# Patient Record
Sex: Male | Born: 1940 | Race: Black or African American | Hispanic: No | Marital: Married | Smoking: Never smoker
Health system: Southern US, Community
[De-identification: ages and names within clinical notes are randomized; demographics above are authoritative.]

## PROBLEM LIST (undated history)

## (undated) DIAGNOSIS — E119 Type 2 diabetes mellitus without complications: Secondary | ICD-10-CM

## (undated) DIAGNOSIS — R29810 Facial weakness: Secondary | ICD-10-CM

## (undated) DIAGNOSIS — Z86718 Personal history of other venous thrombosis and embolism: Secondary | ICD-10-CM

## (undated) DIAGNOSIS — I480 Paroxysmal atrial fibrillation: Secondary | ICD-10-CM

## (undated) DIAGNOSIS — I4891 Unspecified atrial fibrillation: Secondary | ICD-10-CM

## (undated) DIAGNOSIS — I10 Essential (primary) hypertension: Secondary | ICD-10-CM

## (undated) DIAGNOSIS — G4733 Obstructive sleep apnea (adult) (pediatric): Secondary | ICD-10-CM

## (undated) DIAGNOSIS — R600 Localized edema: Secondary | ICD-10-CM

## (undated) DIAGNOSIS — E785 Hyperlipidemia, unspecified: Secondary | ICD-10-CM

## (undated) DIAGNOSIS — C07 Malignant neoplasm of parotid gland: Secondary | ICD-10-CM

## (undated) DIAGNOSIS — R9439 Abnormal result of other cardiovascular function study: Secondary | ICD-10-CM

## (undated) HISTORY — DX: Paroxysmal atrial fibrillation: I48.0

## (undated) HISTORY — DX: Personal history of other venous thrombosis and embolism: Z86.718

## (undated) HISTORY — DX: Localized edema: R60.0

## (undated) HISTORY — DX: Facial weakness: R29.810

## (undated) HISTORY — DX: Type 2 diabetes mellitus without complications: E11.9

## (undated) HISTORY — DX: Obstructive sleep apnea (adult) (pediatric): G47.33

## (undated) HISTORY — DX: Unspecified atrial fibrillation: I48.91

## (undated) HISTORY — DX: Abnormal result of other cardiovascular function study: R94.39

## (undated) HISTORY — DX: Hyperlipidemia, unspecified: E78.5

## (undated) HISTORY — DX: Malignant neoplasm of parotid gland: C07

## (undated) HISTORY — DX: Essential (primary) hypertension: I10

---

## 1997-07-21 ENCOUNTER — Ambulatory Visit (HOSPITAL_COMMUNITY): Admission: RE | Admit: 1997-07-21 | Discharge: 1997-07-21 | Payer: Self-pay | Admitting: Cardiology

## 1999-03-07 ENCOUNTER — Encounter: Admission: RE | Admit: 1999-03-07 | Discharge: 1999-06-05 | Payer: Self-pay | Admitting: Cardiology

## 1999-03-14 ENCOUNTER — Ambulatory Visit (HOSPITAL_COMMUNITY): Admission: RE | Admit: 1999-03-14 | Discharge: 1999-03-14 | Payer: Self-pay | Admitting: Gastroenterology

## 2005-04-09 ENCOUNTER — Encounter: Admission: RE | Admit: 2005-04-09 | Discharge: 2005-04-09 | Payer: Self-pay | Admitting: Orthopedic Surgery

## 2005-05-12 ENCOUNTER — Ambulatory Visit (HOSPITAL_COMMUNITY): Admission: RE | Admit: 2005-05-12 | Discharge: 2005-05-12 | Payer: Self-pay | Admitting: Orthopedic Surgery

## 2005-06-01 ENCOUNTER — Encounter: Admission: RE | Admit: 2005-06-01 | Discharge: 2005-08-30 | Payer: Self-pay | Admitting: Orthopedic Surgery

## 2007-09-30 DIAGNOSIS — R9439 Abnormal result of other cardiovascular function study: Secondary | ICD-10-CM

## 2007-09-30 HISTORY — DX: Abnormal result of other cardiovascular function study: R94.39

## 2007-10-23 HISTORY — PX: CARDIAC CATHETERIZATION: SHX172

## 2008-01-01 DIAGNOSIS — G4733 Obstructive sleep apnea (adult) (pediatric): Secondary | ICD-10-CM

## 2008-01-01 HISTORY — DX: Obstructive sleep apnea (adult) (pediatric): G47.33

## 2010-06-08 ENCOUNTER — Inpatient Hospital Stay (HOSPITAL_COMMUNITY)
Admission: EM | Admit: 2010-06-08 | Discharge: 2010-06-10 | DRG: 074 | Disposition: A | Discharge status: Home / Self Care | Payer: Medicare Other | Attending: Family Medicine | Admitting: Family Medicine

## 2010-06-08 ENCOUNTER — Emergency Department (HOSPITAL_COMMUNITY): Payer: Medicare Other

## 2010-06-08 DIAGNOSIS — M6282 Rhabdomyolysis: Secondary | ICD-10-CM | POA: Diagnosis present

## 2010-06-08 DIAGNOSIS — IMO0001 Reserved for inherently not codable concepts without codable children: Secondary | ICD-10-CM | POA: Diagnosis present

## 2010-06-08 DIAGNOSIS — Z7901 Long term (current) use of anticoagulants: Secondary | ICD-10-CM

## 2010-06-08 DIAGNOSIS — Z7982 Long term (current) use of aspirin: Secondary | ICD-10-CM

## 2010-06-08 DIAGNOSIS — I10 Essential (primary) hypertension: Secondary | ICD-10-CM | POA: Diagnosis present

## 2010-06-08 DIAGNOSIS — I4891 Unspecified atrial fibrillation: Secondary | ICD-10-CM | POA: Diagnosis present

## 2010-06-08 DIAGNOSIS — G510 Bell's palsy: Principal | ICD-10-CM | POA: Diagnosis present

## 2010-06-08 DIAGNOSIS — R609 Edema, unspecified: Secondary | ICD-10-CM | POA: Diagnosis present

## 2010-06-08 DIAGNOSIS — G4733 Obstructive sleep apnea (adult) (pediatric): Secondary | ICD-10-CM | POA: Diagnosis present

## 2010-06-08 LAB — CK TOTAL AND CKMB (NOT AT ARMC)
CK, MB: 8.3 ng/mL (ref 0.3–4.0)
Total CK: 979 U/L — ABNORMAL HIGH (ref 7–232)

## 2010-06-08 LAB — URINALYSIS, ROUTINE W REFLEX MICROSCOPIC
Bilirubin Urine: NEGATIVE
Hgb urine dipstick: NEGATIVE
Ketones, ur: NEGATIVE mg/dL
Protein, ur: NEGATIVE mg/dL
Urobilinogen, UA: 1 mg/dL (ref 0.0–1.0)

## 2010-06-08 LAB — COMPREHENSIVE METABOLIC PANEL
ALT: 36 U/L (ref 0–53)
AST: 40 U/L — ABNORMAL HIGH (ref 0–37)
Albumin: 4 g/dL (ref 3.5–5.2)
Alkaline Phosphatase: 75 U/L (ref 39–117)
CO2: 29 mEq/L (ref 19–32)
Calcium: 9.5 mg/dL (ref 8.4–10.5)
Creatinine, Ser: 0.97 mg/dL (ref 0.4–1.5)
Glucose, Bld: 195 mg/dL — ABNORMAL HIGH (ref 70–99)
Sodium: 137 mEq/L (ref 135–145)
Total Protein: 7.4 g/dL (ref 6.0–8.3)

## 2010-06-08 LAB — CBC
MCV: 85.2 fL (ref 78.0–100.0)
Platelets: 196 10*3/uL (ref 150–400)
WBC: 5.9 10*3/uL (ref 4.0–10.5)

## 2010-06-08 LAB — DIFFERENTIAL
Basophils Absolute: 0 10*3/uL (ref 0.0–0.1)
Basophils Relative: 1 % (ref 0–1)
Eosinophils Relative: 7 % — ABNORMAL HIGH (ref 0–5)
Monocytes Absolute: 0.6 10*3/uL (ref 0.1–1.0)
Neutro Abs: 2.4 10*3/uL (ref 1.7–7.7)

## 2010-06-08 LAB — TROPONIN I: Troponin I: 0.03 ng/mL (ref 0.00–0.06)

## 2010-06-08 LAB — PROTIME-INR: Prothrombin Time: 27.7 seconds — ABNORMAL HIGH (ref 11.6–15.2)

## 2010-06-09 ENCOUNTER — Inpatient Hospital Stay (HOSPITAL_COMMUNITY): Payer: Medicare Other

## 2010-06-09 DIAGNOSIS — M7989 Other specified soft tissue disorders: Secondary | ICD-10-CM

## 2010-06-09 DIAGNOSIS — R29810 Facial weakness: Secondary | ICD-10-CM

## 2010-06-09 DIAGNOSIS — G8190 Hemiplegia, unspecified affecting unspecified side: Secondary | ICD-10-CM

## 2010-06-09 HISTORY — DX: Facial weakness: R29.810

## 2010-06-09 LAB — LIPID PANEL
HDL: 31 mg/dL — ABNORMAL LOW (ref >39)
LDL Cholesterol: 24 mg/dL (ref 0–99)
Total CHOL/HDL Ratio: 3.3 RATIO
VLDL: 48 mg/dL — ABNORMAL HIGH (ref 0–40)

## 2010-06-09 LAB — CARDIAC PANEL(CRET KIN+CKTOT+MB+TROPI)
CK, MB: 5.4 ng/mL — ABNORMAL HIGH (ref 0.3–4.0)
Total CK: 661 U/L — ABNORMAL HIGH (ref 7–232)
Troponin I: 0.01 ng/mL (ref 0.00–0.06)

## 2010-06-09 LAB — GLUCOSE, CAPILLARY
Glucose-Capillary: 192 mg/dL — ABNORMAL HIGH (ref 70–99)
Glucose-Capillary: 284 mg/dL — ABNORMAL HIGH (ref 70–99)

## 2010-06-09 LAB — HEMOGLOBIN A1C: Mean Plasma Glucose: 220 mg/dL — ABNORMAL HIGH (ref <117)

## 2010-06-10 LAB — CBC
HCT: 39 % (ref 39.0–52.0)
Hemoglobin: 13.1 g/dL (ref 13.0–17.0)
MCHC: 33.6 g/dL (ref 30.0–36.0)
MCV: 85.2 fL (ref 78.0–100.0)
RDW: 13.5 % (ref 11.5–15.5)

## 2010-06-10 LAB — BASIC METABOLIC PANEL
BUN: 12 mg/dL (ref 6–23)
CO2: 29 mEq/L (ref 19–32)
Chloride: 102 mEq/L (ref 96–112)
Creatinine, Ser: 0.94 mg/dL (ref 0.4–1.5)
GFR calc Af Amer: >60 mL/min (ref >60)
Potassium: 3.7 mEq/L (ref 3.5–5.1)

## 2010-06-10 LAB — TSH: TSH: 1.761 u[IU]/mL (ref 0.350–4.500)

## 2010-06-10 LAB — GLUCOSE, CAPILLARY
Glucose-Capillary: 231 mg/dL — ABNORMAL HIGH (ref 70–99)
Glucose-Capillary: 188 mg/dL — ABNORMAL HIGH (ref 70–99)

## 2010-06-14 NOTE — H&P (Signed)
NAME:  Jesse Wall, Jesse Wall NO.:  [id number]  MEDICAL RECORD NO.:  [id number]           PATIENT TYPE:  E  LOCATION:  [facility]                         FACILITY:  [facility]  PHYSICIAN:  Erick Blinks, MD     DATE OF BIRTH:  1940-06-25  DATE OF ADMISSION:  06/08/2010 DATE OF DISCHARGE:                             HISTORY & PHYSICAL   CHIEF COMPLAINT:  Right-sided facial droop.  HISTORY OF PRESENT ILLNESS:  This is a 70 year old male with history of hypertension, diabetes, and atrial fibrillation on Coumadin, who presents to the emergency room with complaints of right facial droop. The patient had noticed the onset of this approximately 2-3 days ago. His symptoms were also noted by his wife today.  They presents to the emergency room for evaluation.  The patient denies any slurring of his speech.  He has not had any dysphagia.  No changes in vision.  No headache.  Otherwise, he does not have any other weakness in his extremities.  He denies any numbness.  He has not had any chest pain, shortness of breath, cough, fever, dysuria, hematuria, nausea, vomiting, abdominal pain, or diarrhea.  He does have some lower extremity edema which he reports as being off and on for the past 1-2 years.  His wife noted that he has more swelling in his right lower extremity today.  The patient had a CT scan of the head in the emergency room which was found be negative and he has been referred for admission for further stroke workup.  PAST MEDICAL HISTORY: 1. Hypertension. 2. Diabetes. 3. Atrial fibrillation.  ALLERGIES:  No known drug allergies.  MEDICATIONS PRIOR TO ADMISSION: 1. Tribenzor 40/5/25 mg 1 tablet every morning. 2. Calcium/vitamin D over-the-counter 1 tablet every morning. 3. Fish oil over-the-counter once daily. 4. Cod liver oil 1 capsule every morning. 5. Vitamin D3 1000 units 1 capsule every morning. 6. Vitamin C over-the-counter 1 tablet every morning. 7. Multivitamin  1 tablet every morning. 8. Lipitor 20 mg every morning. 9. Coumadin 7.5 mg every evening. 10.Coreg 25 mg b.i.d. 11.Janumet 50/1000 mg p.o. every evening. 12.Aspirin 325 mg every morning.  SOCIAL HISTORY:  The patient is a nonsmoker, does not drink, does not use any drugs.  He has currently retired, was previously in education, and he lives at home with his wife.  He walks independently and is fairly active.  FAMILY HISTORY:  Noncontributory.  REVIEW OF SYSTEMS:  As per HPI.  PHYSICAL EXAMINATION:  VITAL SIGNS:  Temperature 98.6, respiratory rate of 16, heart rate of 60, blood pressure 151/71, pulse ox of 100% on room air. GENERAL:  The patient in no acute distress, lying comfortably in bed. HEENT:  Normocephalic, atraumatic.  Pupils are equal, round, reactive to light.  There is right-sided facial droop. NECK:  Supple. CHEST:  Clear to auscultation bilaterally. CARDIAC:  S1 and S2 which appears to be regular rate and rhythm. ABDOMEN:  Soft, nontender.  Bowel sounds are active. EXTREMITIES:  No signs of cyanosis or clubbing.  There is 1-2+ edema in the lower extremities bilaterally, even more so on the right side. NEUROLOGIC:  The patient has 5/5 strength bilaterally in his extremities.  There is a right-sided facial droop.  LABORATORY DATA:  INR of 2.5, WBC 5.9, hemoglobin 13.7, platelet count of 196,000.  Sodium 137, potassium 4.0, chloride 103, bicarb 29, glucose 195, BUN 15, creatinine 0.97, calcium 9.5, albumin 4.0.  Liver enzymes within normal limits.  Troponin of 0.03, CK-MB of 9.6, and total CK of 1021.  EKG is not available at this time.  CT head shows no acute intracranial abnormality.  ASSESSMENT AND PLAN: 1. Right-sided facial droop.  We will admit the patient to a telemetry     unit and complete the stroke workup with MRI of the brain, carotid     Dopplers, and 2-D echocardiogram.  We will continue him on Coumadin     as well as his aspirin. 2. Elevated creatine  kinase.  This may be secondary to rhabdomyolysis.     We will hold his Lipitor for now.  We will get an EKG to rule out     any ischemic changes.  We will also cycle his cardiac enzymes.  The     patient already received 2-D echocardiogram and he is already on     aspirin.  He denies any chest pain at this time. 3. Atrial fibrillation.  We will continue the patient on Coumadin per     Pharmacy. 4. Hypertension.  We will continue his outpatient medicines. 5. Pedal edema.  This may be secondary to poor venous circulation.  We     have already rechecked an 2-D echocardiogram to rule out any     congestive heart failure.  We will also check lower extremity     Dopplers to rule out any underlying deep venous thrombosis,     although this is unlikely since the patient is therapeutic on his     Coumadin. 6. Type 2 diabetes.  We will continue on sliding scale insulin and     continue his outpatient regimen. 7. Code status.  The patient is full code.  Further orders per the     clinical course.     Erick Blinks, MD     JM/MEDQ  D:  06/08/2010  T:  06/08/2010  Job:  [id number]  Electronically Signed by Durward Mallard Sophiagrace Benbrook  on 06/14/2010 09:47:04 PM

## 2010-06-22 NOTE — Discharge Summary (Signed)
NAME:  Jesse Wall, Jesse Wall NO.:  [id number]  MEDICAL RECORD NO.:  [id number]           PATIENT TYPE:  I  LOCATION:  3016                         FACILITY:  [facility]  PHYSICIAN:  Brendia Sacks, MD    DATE OF BIRTH:  26-Mar-1940  DATE OF ADMISSION:  06/08/2010 DATE OF DISCHARGE:  06/10/2010                              DISCHARGE SUMMARY   DISCHARGE DIAGNOSES: 1. Bell palsy. 2. Diabetes mellitus, uncontrolled by hemoglobin A1c, stable as an     inpatient. 3. Elevated creatinine kinase and mild elevation of CK-MB, trending     downwards. 4. Atrial fibrillation, stable.  HISTORY OF PRESENT ILLNESS:  This is a 70 year old man who appears younger than his stated age who presented with right-sided facial weakness.  HOSPITAL COURSE: 1. Bell palsy.  The patient was admitted for stroke evaluation.  This     was negative.  Presumptive diagnosis at this point is Bell palsy.     He was seen by physical therapy and not felt to have any outpatient     needs and seen by speech therapy and not felt to have any     outpatient needs.  A stroke workup was completely negative and he     is stable for discharge at this time.  Literature does suggest that     steroids are beneficial in all patients with Bell palsy and I have     recommended this to him, although I have advised that it is likely     to increase his blood sugars in the short-term although he will     only be on it for a short period of time.  Ultimately, the steroids     were started within 3 days and he does appear to be outside of that     window.  The patient will need to follow his blood sugars closely     and follow with his primary care physician. 2. Diabetes mellitus, uncontrolled by hemoglobin A1c.  Stable as an     inpatient and continue his chronic medications.  Follow up in the     outpatient setting. 3. Mild elevation of total CK and CK-MB.  This may be related to     statin therapy.  We will hold his  Lipitor until he follows up with     his primary care physician in the next few weeks.  Suggest repeat     CK and CK-MB at that time.  Defer restarting this to his primary     care physician. 4. Atrial fibrillation.  This has remained stable.  Continue Coreg and     warfarin.  CONSULTATIONS:  None.  PROCEDURES:  None.  IMAGING: 1. Chest CT of head, April 18:  No acute intracranial findings. 2. MRI of the brain, April 20:  No acute infarct.  Mild-to-moderate     small vessel disease-type changes. 3. MRA of the head, April 20:  Mild branch vessel irregularity.     Nonvisualization of the right PICA.  No discrete saccular aneurysm.     Focal bulge in superior aspect of the M1 segment  of the right     middle cerebral artery which represents origin of the vessel.  It     could be followed up in the outpatient setting as clinically     indicated.  ANCILLARY STUDIES: 1. A 2-D echocardiogram, April 19:  Left ventricular ejection fraction     65-70%, normal wall motion.  Grade 1 diastolic dysfunction. 2. Bilateral carotid ultrasound:  Negative for significant     extracranial stenosis.  Vertebrals were patent with antegrade flow. 3. Bilateral lower extremity venous Dopplers were negative for DVT.  PERTINENT LABORATORY STUDIES: 1. Triglycerides are 242, LDL was 24, HDL 31, and total cholesterol     103. 2. Hemoglobin A1c 9.3. 3. TSH 1.761. 4. Vitamin B12 was within normal limits. 5. CBC was unremarkable. 6. Discharge INR 2.69. 7. Capillary blood sugars stable. 8. Basic metabolic panel unremarkable. 9. Cardiac enzymes notable for a peak CK of 1021 and a peak MB of 9.6.     On discharge, CK is 661 and MB is 5.4.  Normal troponins.  PHYSICAL EXAMINATION ON DISCHARGE:  GENERAL:  The patient was feeling and there are no issues.  No focal neurologic deficits other than in his face.  His wife and the patient feel that his facial asymmetry and weakness have improved.  It is most dramatic  when he smiles. VITAL SIGNS:  He remains afebrile, temperature is 98.1, pulse 58, respirations 17, blood pressure 121/70, and saturation 93% on room air. CARDIOVASCULAR:  Regular rate and rhythm.  No murmur, rub, or gallop. RESPIRATORY:  Clear to auscultation bilaterally.  No wheezes, rales, or rhonchi.  Normal respiratory effort. NEUROLOGIC:  Cranial nerves are notable for weakness of his lower lip with smile, but he is able to close his eyes bilaterally symmetrically and quite tightly.  He has normal furrowing of his brow.  DISCHARGE INSTRUCTIONS:  The patient will be discharged home today. Diet is diabetic, heart-healthy diet.  Activity is as tolerated.  He should follow up with Dr. Andi Devon in 2-4 weeks.  DISCHARGE MEDICATIONS: 1. Artificial tears, use liberally as needed for dry eyes. 2. Prednisone 20 mg 3 tablets p.o. q.a.m. with food for 7 days.     Follow your blood sugars while on this medication and discuss     significant hyperglycemia with your primary care physician. 3. Aspirin 325 mg p.o. daily. 4. Calcium carbonate and vitamin D p.o. daily. 5. Carvedilol 25 mg p.o. b.i.d. 6. Cod liver oil daily. 7. Fish oil over-the-counter p.o. daily. 8. Janumet 50/1000 mg p.o. at bedtime. 9. Multivitamin p.o. daily. 10.Tribenzor 40/5/25 p.o. daily. 11.Vitamin C over-the-counter p.o. daily. 12.Vitamin D3, 1000 units every day. 13.Warfarin 7.5 mg daily.  Discontinue the following medications:  Atorvastatin.  See rationale above.  THINGS TO FOLLOW UP IN THE OUTPATIENT SETTING: 1. Continue treatment for diabetes. 2. Elevated CK and CK-MB.  See discussion above.  TIME COORDINATING DISCHARGE:  35 minutes.     Brendia Sacks, MD     DG/MEDQ  D:  06/10/2010  T:  06/11/2010  Job:  [id number]  Electronically Signed by Brendia Sacks  on 06/22/2010 09:48:56 PM

## 2010-11-07 ENCOUNTER — Other Ambulatory Visit: Payer: Self-pay | Admitting: Otolaryngology

## 2010-11-07 ENCOUNTER — Other Ambulatory Visit (HOSPITAL_COMMUNITY)
Admission: RE | Admit: 2010-11-07 | Discharge: 2010-11-07 | Disposition: A | Discharge status: Home / Self Care | Payer: Medicare Other | Source: Ambulatory Visit | Attending: Otolaryngology | Admitting: Otolaryngology

## 2010-11-07 DIAGNOSIS — R221 Localized swelling, mass and lump, neck: Secondary | ICD-10-CM

## 2010-11-07 DIAGNOSIS — R220 Localized swelling, mass and lump, head: Secondary | ICD-10-CM | POA: Insufficient documentation

## 2010-11-08 ENCOUNTER — Ambulatory Visit
Admission: RE | Admit: 2010-11-08 | Discharge: 2010-11-08 | Disposition: A | Discharge status: Home / Self Care | Payer: Medicare Other | Source: Ambulatory Visit | Attending: Otolaryngology | Admitting: Otolaryngology

## 2010-11-08 DIAGNOSIS — R221 Localized swelling, mass and lump, neck: Secondary | ICD-10-CM

## 2010-11-08 MED ORDER — IOHEXOL 300 MG/ML  SOLN
75.0000 mL | Freq: Once | INTRAMUSCULAR | Status: AC | PRN
  Administered 2010-11-08: 75 mL via INTRAVENOUS

## 2010-11-09 ENCOUNTER — Other Ambulatory Visit: Payer: Self-pay | Admitting: Otolaryngology

## 2010-11-09 ENCOUNTER — Other Ambulatory Visit (HOSPITAL_COMMUNITY): Payer: Self-pay | Admitting: Otolaryngology

## 2010-11-09 DIAGNOSIS — K118 Other diseases of salivary glands: Secondary | ICD-10-CM

## 2010-11-10 ENCOUNTER — Ambulatory Visit
Admission: RE | Admit: 2010-11-10 | Discharge: 2010-11-10 | Disposition: A | Discharge status: Home / Self Care | Payer: Medicare Other | Source: Ambulatory Visit | Attending: Otolaryngology | Admitting: Otolaryngology

## 2010-11-10 DIAGNOSIS — K118 Other diseases of salivary glands: Secondary | ICD-10-CM

## 2010-11-10 MED ORDER — GADOBENATE DIMEGLUMINE 529 MG/ML IV SOLN
20.0000 mL | Freq: Once | INTRAVENOUS | Status: AC | PRN
  Administered 2010-11-10: 20 mL via INTRAVENOUS

## 2010-11-11 ENCOUNTER — Other Ambulatory Visit (HOSPITAL_COMMUNITY): Payer: Medicare Other

## 2010-11-16 ENCOUNTER — Other Ambulatory Visit (HOSPITAL_COMMUNITY): Payer: Medicare Other

## 2011-07-27 ENCOUNTER — Ambulatory Visit
Admission: RE | Admit: 2011-07-27 | Discharge: 2011-07-27 | Disposition: A | Discharge status: Home / Self Care | Payer: Medicare Other | Source: Ambulatory Visit | Attending: Family Medicine | Admitting: Family Medicine

## 2011-07-27 ENCOUNTER — Other Ambulatory Visit: Payer: Self-pay | Admitting: Family Medicine

## 2011-07-27 DIAGNOSIS — R52 Pain, unspecified: Secondary | ICD-10-CM

## 2012-03-04 ENCOUNTER — Encounter: Payer: Self-pay | Admitting: *Deleted

## 2012-05-08 ENCOUNTER — Ambulatory Visit: Payer: Self-pay | Admitting: Cardiovascular Disease

## 2012-05-08 DIAGNOSIS — I4891 Unspecified atrial fibrillation: Secondary | ICD-10-CM | POA: Insufficient documentation

## 2012-05-08 DIAGNOSIS — Z7901 Long term (current) use of anticoagulants: Secondary | ICD-10-CM | POA: Insufficient documentation

## 2012-07-18 ENCOUNTER — Ambulatory Visit (INDEPENDENT_AMBULATORY_CARE_PROVIDER_SITE_OTHER): Payer: Medicare PPO | Admitting: Pharmacist Clinician (PhC)/ Clinical Pharmacy Specialist

## 2012-07-18 VITALS — BP 148/72 | HR 60

## 2012-07-18 DIAGNOSIS — Z7901 Long term (current) use of anticoagulants: Secondary | ICD-10-CM

## 2012-07-18 DIAGNOSIS — I4891 Unspecified atrial fibrillation: Secondary | ICD-10-CM

## 2012-07-18 LAB — POCT INR: INR: 3

## 2012-07-29 ENCOUNTER — Other Ambulatory Visit: Payer: Self-pay | Admitting: Cardiovascular Disease

## 2012-08-15 ENCOUNTER — Ambulatory Visit (INDEPENDENT_AMBULATORY_CARE_PROVIDER_SITE_OTHER): Payer: Medicare PPO | Admitting: Pharmacist Clinician (PhC)/ Clinical Pharmacy Specialist

## 2012-08-15 VITALS — BP 140/66 | HR 60

## 2012-08-15 DIAGNOSIS — I4891 Unspecified atrial fibrillation: Secondary | ICD-10-CM

## 2012-08-15 DIAGNOSIS — Z7901 Long term (current) use of anticoagulants: Secondary | ICD-10-CM

## 2012-08-15 LAB — POCT INR: INR: 3.4

## 2012-08-26 ENCOUNTER — Other Ambulatory Visit: Payer: Self-pay

## 2012-08-26 MED ORDER — ATORVASTATIN CALCIUM 10 MG PO TABS: 10.0000 mg | ORAL_TABLET | Freq: Every day | ORAL | Status: DC

## 2012-08-26 MED ORDER — CARVEDILOL 25 MG PO TABS: 25.0000 mg | ORAL_TABLET | Freq: Two times a day (BID) | ORAL | Status: DC

## 2012-08-26 NOTE — Telephone Encounter (Signed)
Rx was sent to pharmacy electronically. 

## 2012-09-06 ENCOUNTER — Ambulatory Visit: Payer: Medicare PPO | Admitting: Pharmacist Clinician (PhC)/ Clinical Pharmacy Specialist

## 2012-09-09 ENCOUNTER — Ambulatory Visit (INDEPENDENT_AMBULATORY_CARE_PROVIDER_SITE_OTHER): Payer: Medicare PPO | Admitting: Pharmacist Clinician (PhC)/ Clinical Pharmacy Specialist

## 2012-09-09 VITALS — BP 150/70 | HR 72

## 2012-09-09 DIAGNOSIS — I4891 Unspecified atrial fibrillation: Secondary | ICD-10-CM

## 2012-09-09 DIAGNOSIS — Z7901 Long term (current) use of anticoagulants: Secondary | ICD-10-CM

## 2012-09-19 ENCOUNTER — Encounter: Payer: Self-pay | Admitting: Cardiovascular Disease

## 2012-09-19 ENCOUNTER — Ambulatory Visit (INDEPENDENT_AMBULATORY_CARE_PROVIDER_SITE_OTHER): Payer: Medicare PPO | Admitting: Cardiovascular Disease

## 2012-09-19 VITALS — BP 136/82 | HR 61 | Ht 74.0 in | Wt 254.0 lb

## 2012-09-19 DIAGNOSIS — I10 Essential (primary) hypertension: Secondary | ICD-10-CM

## 2012-09-19 DIAGNOSIS — E785 Hyperlipidemia, unspecified: Secondary | ICD-10-CM

## 2012-09-19 DIAGNOSIS — I4891 Unspecified atrial fibrillation: Secondary | ICD-10-CM

## 2012-09-19 DIAGNOSIS — E119 Type 2 diabetes mellitus without complications: Secondary | ICD-10-CM | POA: Insufficient documentation

## 2012-09-19 DIAGNOSIS — G4733 Obstructive sleep apnea (adult) (pediatric): Secondary | ICD-10-CM | POA: Insufficient documentation

## 2012-09-19 DIAGNOSIS — I480 Paroxysmal atrial fibrillation: Secondary | ICD-10-CM | POA: Insufficient documentation

## 2012-09-19 NOTE — Assessment & Plan Note (Signed)
On statin therapy followed by his PCP 

## 2012-09-19 NOTE — Assessment & Plan Note (Signed)
Well-controlled on current medications 

## 2012-09-19 NOTE — Progress Notes (Signed)
09/19/2012 Jesse Wall   26-May-1940  [id number]  Primary Physician Alva Garnet., MD Primary Cardiologist: Runell Gess MD [name]   HPI:  The patient is a delightful, 72 year old, moderately overweight, married [place] American male, father of 3 children, grandfather to 1 grandchild who I last saw 6 months ago. 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 His other vessels were normal, as was his LV function. The cath was done after a mildly abnormal Myoview. His other problems include obstructive sleep apnea and no longer wearing CPAP, hypertension, hyperlipidemia and type 2 diabetes. He denies chest pain or shortness of breath. 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 He does complain of dependent edema in his left- greater-than-right lower extremity consistent with venous reflux with some cutaneous changes thereof. 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 He did have venous reflux in the right greater saphenous vein. He denies chest pain or shortness of breath. He's not had any recurrent episodes of PAF and is on Coumadin anticoagulation.    Current Outpatient Prescriptions  Medication Sig Dispense Refill  . aspirin 325 MG tablet Take 325 mg by mouth daily.      Marland Kitchen atorvastatin (LIPITOR) 10 MG tablet Take 1 tablet (10 mg total) by mouth daily.  90 tablet  0  . calcium carbonate (OS-CAL) 600 MG TABS Take 600 mg by mouth daily.      . carvedilol (COREG) 25 MG tablet Take 25 mg by mouth. 1/2 tablet twice a day      . cholecalciferol (VITAMIN D) 1000 UNITS tablet Take 1,000 Units by mouth daily.      . Coenzyme Q10 (CO Q 10) 100 MG CAPS Take 1 capsule by mouth daily.         . fish oil-omega-3 fatty acids 1000 MG capsule Take 1 g by mouth daily.      . Magnesium 250 MG TABS Take 250 mg by mouth 2 (two) times daily.      . Multiple Vitamin (MULTIVITAMIN) capsule Take 1 capsule by mouth daily.      Marland Kitchen PREVIDENT 5000 BOOSTER PLUS 1.1 % PSTE       . sitaGLIPtan-metformin (JANUMET) 50-1000 MG per tablet Take 1 tablet by mouth daily.       . valsartan-hydrochlorothiazide (DIOVAN-HCT) 160-25 MG per tablet       . vitamin C (ASCORBIC ACID) 500 MG tablet Take 500 mg by mouth daily.      Marland Kitchen warfarin (COUMADIN) 7.5 MG tablet TAKE 1 TABLET BY MOUTH EVERY DAY  90 tablet  2   No current facility-administered medications for this visit.    No Known Allergies  History   Social History  . Marital Status: Married    Spouse Name: N/A    Number of Children: 3  . Years of Education: N/A   Occupational History  . Not on file.   Social History Main Topics  . Smoking status: Never Smoker   . Smokeless tobacco: Not on file  . Alcohol Use: Not on file  . Drug Use: Not on file  . Sexually Active: Not on file   Other Topics Concern  . Not on file   Social History Narrative  .  No narrative on file     Review of Systems: General: negative for chills, fever, night sweats or weight changes.  Cardiovascular: negative for chest pain, dyspnea on exertion, edema, orthopnea, palpitations, paroxysmal nocturnal dyspnea or shortness of breath Dermatological: negative for rash Respiratory: negative for cough or wheezing Urologic: negative for hematuria Abdominal: negative for nausea, vomiting, diarrhea, bright red blood per rectum, melena, or hematemesis Neurologic: negative for visual changes, syncope, or dizziness All other systems reviewed and are otherwise negative except as noted above.    Blood pressure 136/82, pulse 61, height 6\' 2"  (1.88 m), weight 254 lb (115.214 kg).  General appearance: alert and no distress Neck: no adenopathy, no carotid bruit, no JVD, supple,  symmetrical, trachea midline and thyroid not enlarged, symmetric, no tenderness/mass/nodules Lungs: clear to auscultation bilaterally Heart: regular rate and rhythm, S1, S2 normal, no murmur, click, rub or gallop Extremities: extremities normal, atraumatic, no cyanosis or edema  EKG normal sinus rhythm at 61 without ST or T wave changes  ASSESSMENT AND PLAN:   Essential hypertension Well-controlled on current medications  Hyperlipidemia On statin therapy followed by his PCP  Atrial fibrillation Maintaining sinus rhythm on Coumadin anticoagulation      Runell Gess MD [facility], [facility] 09/19/2012 4:54 PM

## 2012-09-19 NOTE — Assessment & Plan Note (Signed)
Maintaining sinus rhythm on Coumadin anticoagulation 

## 2012-09-19 NOTE — Patient Instructions (Addendum)
Your physician wants you to follow-up in: 12 months with Dr Berry. You will receive a reminder letter in the mail two months in advance. If you don't receive a letter, please call our office to schedule the follow-up appointment.  

## 2012-10-03 ENCOUNTER — Ambulatory Visit (INDEPENDENT_AMBULATORY_CARE_PROVIDER_SITE_OTHER): Payer: Medicare PPO | Admitting: Pharmacist Clinician (PhC)/ Clinical Pharmacy Specialist

## 2012-10-03 VITALS — BP 150/66 | HR 64

## 2012-10-03 DIAGNOSIS — I4891 Unspecified atrial fibrillation: Secondary | ICD-10-CM

## 2012-10-03 DIAGNOSIS — Z7901 Long term (current) use of anticoagulants: Secondary | ICD-10-CM

## 2012-10-24 ENCOUNTER — Ambulatory Visit (INDEPENDENT_AMBULATORY_CARE_PROVIDER_SITE_OTHER): Payer: Medicare PPO | Admitting: Pharmacist Clinician (PhC)/ Clinical Pharmacy Specialist

## 2012-10-24 VITALS — BP 136/70 | HR 64

## 2012-10-24 DIAGNOSIS — I4891 Unspecified atrial fibrillation: Secondary | ICD-10-CM

## 2012-10-24 DIAGNOSIS — Z7901 Long term (current) use of anticoagulants: Secondary | ICD-10-CM

## 2012-11-11 ENCOUNTER — Telehealth: Payer: Self-pay | Admitting: Pharmacist Clinician (PhC)/ Clinical Pharmacy Specialist

## 2012-11-11 NOTE — Telephone Encounter (Signed)
Does he need to be off his blood thinner for dental procedure? If so how long? Please call asap

## 2012-11-11 NOTE — Telephone Encounter (Signed)
Pt to have 1 tooth extracted.  Advised we prefer not to stop warfarin other that night before.  Pt can use ice/compresses to help decrease bleeding risk.

## 2012-11-15 ENCOUNTER — Telehealth: Payer: Self-pay | Admitting: Cardiovascular Disease

## 2012-11-15 NOTE — Telephone Encounter (Signed)
Having surgery and needs to know when he should be off of his coumadin , just had a tooth extraction and not really on it right now and was suggested to that he stays off of it until the surgery , would like to confirm that .Marland Kitchen Please Call    Thanks

## 2012-11-15 NOTE — Telephone Encounter (Signed)
Pt had tooth extracted Wednesday (9/24) and has held coumadin since then (on his own).  Was planning to restart today, but now scheduled for endoscopy on Tuesday 9/30.  Wanted to know if okay to continue holding warfarin until then.  Advised to continue holding and restart Tuesday pm after procedure.  Pt voiced understanding.

## 2012-11-21 ENCOUNTER — Ambulatory Visit: Payer: Medicare PPO | Admitting: Pharmacist Clinician (PhC)/ Clinical Pharmacy Specialist

## 2012-11-25 ENCOUNTER — Ambulatory Visit (INDEPENDENT_AMBULATORY_CARE_PROVIDER_SITE_OTHER): Payer: Medicare PPO | Admitting: Pharmacist Clinician (PhC)/ Clinical Pharmacy Specialist

## 2012-11-25 VITALS — BP 140/68 | HR 64

## 2012-11-25 DIAGNOSIS — I4891 Unspecified atrial fibrillation: Secondary | ICD-10-CM

## 2012-11-25 DIAGNOSIS — Z7901 Long term (current) use of anticoagulants: Secondary | ICD-10-CM

## 2012-11-25 LAB — POCT INR: INR: 1.4

## 2012-12-10 ENCOUNTER — Ambulatory Visit (INDEPENDENT_AMBULATORY_CARE_PROVIDER_SITE_OTHER): Payer: Medicare PPO | Admitting: Pharmacist Clinician (PhC)/ Clinical Pharmacy Specialist

## 2012-12-10 VITALS — BP 160/80

## 2012-12-10 DIAGNOSIS — Z7901 Long term (current) use of anticoagulants: Secondary | ICD-10-CM

## 2012-12-10 DIAGNOSIS — I4891 Unspecified atrial fibrillation: Secondary | ICD-10-CM

## 2012-12-26 ENCOUNTER — Ambulatory Visit (INDEPENDENT_AMBULATORY_CARE_PROVIDER_SITE_OTHER): Payer: Medicare PPO | Admitting: Pharmacist Clinician (PhC)/ Clinical Pharmacy Specialist

## 2012-12-26 DIAGNOSIS — I4891 Unspecified atrial fibrillation: Secondary | ICD-10-CM

## 2012-12-26 DIAGNOSIS — Z7901 Long term (current) use of anticoagulants: Secondary | ICD-10-CM

## 2012-12-26 NOTE — Progress Notes (Signed)
Opened in error, pt warfarin on hold for ches tube insertion next Monday

## 2012-12-31 ENCOUNTER — Telehealth: Payer: Self-pay | Admitting: Cardiovascular Disease

## 2012-12-31 NOTE — Telephone Encounter (Signed)
Was on coumadin.  Told to stop a few days ago for procedure.  Procedure was done yesterday.  Wants to know when he can restart and when does he need inr ck and can they get the orders to check inr in his home. Please call

## 2012-12-31 NOTE — Telephone Encounter (Signed)
[name], pharmacist, notified and advised pt came in to last anticoag visit and was holding warfarin for chest tube placement.  Did not check INR.  Advised if chest tube is placed and secure, pt should restart coumadin and have INR check on Monday.  HH can check INR.    Returned call to [place], [facility] nurse.  Informed as stated above and verbalized understanding.  Asked what dose pt is to restart and informed per last anticoag vist: Sun, Tues, Thurs: 7.5mg  and other days 3.75 mg.  Verbalized understanding and will call on Monday w/ PT/INR results.

## 2013-01-06 ENCOUNTER — Telehealth: Payer: Self-pay | Admitting: *Deleted

## 2013-01-06 LAB — POCT INR: INR: 1.3

## 2013-01-06 NOTE — Telephone Encounter (Signed)
Dorene Grebe called in PT/INR results.  Stated pt is supposed to take 7.5 mg Tu/Th and 3.75 all other days.  Stated pt missed last night's dose and tooka t 7am prior to nurse's arrival.  Informed pharmacist will be notified and will call back w/ instructions.  Verbalized understanding.  PT - 12.9 INR - 1.3

## 2013-01-07 ENCOUNTER — Ambulatory Visit (INDEPENDENT_AMBULATORY_CARE_PROVIDER_SITE_OTHER): Payer: Medicare PPO | Admitting: Pharmacist Clinician (PhC)/ Clinical Pharmacy Specialist

## 2013-01-07 DIAGNOSIS — I4891 Unspecified atrial fibrillation: Secondary | ICD-10-CM

## 2013-01-07 DIAGNOSIS — Z7901 Long term (current) use of anticoagulants: Secondary | ICD-10-CM

## 2013-01-07 NOTE — Telephone Encounter (Signed)
See anticoag note

## 2013-01-12 ENCOUNTER — Other Ambulatory Visit: Payer: Self-pay | Admitting: Cardiovascular Disease

## 2013-01-13 ENCOUNTER — Telehealth: Payer: Self-pay | Admitting: Cardiovascular Disease

## 2013-01-13 NOTE — Telephone Encounter (Signed)
Rx was sent to pharmacy electronically. 

## 2013-01-13 NOTE — Telephone Encounter (Signed)
Please advise 

## 2013-01-13 NOTE — Telephone Encounter (Signed)
Jesse Wall [facility] called in PT/INR resuls.  PT 25.7 INR 2.6  Stated pt takes 7.5 mg Sun, Tues, Thurs and 3.75 mg all other days.  Message forwarded to K. Petra Kuba, RN to discuss w/ Dr. Allyson Sabal as [name], PharmD, is out of the office today.

## 2013-01-14 ENCOUNTER — Ambulatory Visit (INDEPENDENT_AMBULATORY_CARE_PROVIDER_SITE_OTHER): Payer: Medicare PPO | Admitting: Pharmacist Clinician (PhC)/ Clinical Pharmacy Specialist

## 2013-01-14 DIAGNOSIS — I4891 Unspecified atrial fibrillation: Secondary | ICD-10-CM

## 2013-01-14 DIAGNOSIS — Z7901 Long term (current) use of anticoagulants: Secondary | ICD-10-CM

## 2013-01-21 ENCOUNTER — Telehealth: Payer: Self-pay | Admitting: Cardiovascular Disease

## 2013-01-21 ENCOUNTER — Ambulatory Visit (INDEPENDENT_AMBULATORY_CARE_PROVIDER_SITE_OTHER): Payer: Medicare PPO | Admitting: Pharmacist Clinician (PhC)/ Clinical Pharmacy Specialist

## 2013-01-21 DIAGNOSIS — I4891 Unspecified atrial fibrillation: Secondary | ICD-10-CM

## 2013-01-21 DIAGNOSIS — Z7901 Long term (current) use of anticoagulants: Secondary | ICD-10-CM

## 2013-01-21 NOTE — Telephone Encounter (Signed)
PT - 23.3 INR - 2.3  Current dose: 7.5 mg Sun, Tues, Thurs and 3.75 mg other days

## 2013-01-21 NOTE — Telephone Encounter (Signed)
See anticoag encounter

## 2013-02-04 ENCOUNTER — Ambulatory Visit (INDEPENDENT_AMBULATORY_CARE_PROVIDER_SITE_OTHER): Payer: Medicare PPO | Admitting: Pharmacist Clinician (PhC)/ Clinical Pharmacy Specialist

## 2013-02-04 ENCOUNTER — Telehealth: Payer: Self-pay | Admitting: Cardiovascular Disease

## 2013-02-04 DIAGNOSIS — I4891 Unspecified atrial fibrillation: Secondary | ICD-10-CM

## 2013-02-04 DIAGNOSIS — Z7901 Long term (current) use of anticoagulants: Secondary | ICD-10-CM

## 2013-02-04 LAB — POCT INR: INR: 1.5

## 2013-02-04 NOTE — Telephone Encounter (Signed)
See anticoag encounter

## 2013-02-04 NOTE — Telephone Encounter (Signed)
INR 1.5 PT 15.2  Takes 7.5 mg Sun, Tues, Thurs and 3.75 mg all other days.  Had 3.75 mg last night, eaten broccoli and just finished a round of chemo last week.  Message forwarded to K. Alvstad, PharmD.

## 2013-02-10 ENCOUNTER — Telehealth: Payer: Self-pay | Admitting: Cardiovascular Disease

## 2013-02-10 NOTE — Telephone Encounter (Signed)
Returned call to [place], [facility] nurse w/ Genevieve Norlander.  Stated orders for INR check today and pt unable to see her today r/t multiple doctor's appts.  Asked for order to check tomorrow.  Informed, per last anticoag note, that next INR check scheduled for tomorrow, 12.23.14.  VO given to check tomorrow per notes.  Verbalized understanding.

## 2013-02-10 NOTE — Telephone Encounter (Signed)
Order for INR written today but patient unable to get done because other dr appts.  Needs to get orders for 02/11/13  Please call can leave voice mail

## 2013-02-11 ENCOUNTER — Ambulatory Visit (INDEPENDENT_AMBULATORY_CARE_PROVIDER_SITE_OTHER): Payer: Medicare PPO | Admitting: Pharmacist Clinician (PhC)/ Clinical Pharmacy Specialist

## 2013-02-11 DIAGNOSIS — Z7901 Long term (current) use of anticoagulants: Secondary | ICD-10-CM

## 2013-02-11 DIAGNOSIS — I4891 Unspecified atrial fibrillation: Secondary | ICD-10-CM

## 2013-02-18 ENCOUNTER — Ambulatory Visit (INDEPENDENT_AMBULATORY_CARE_PROVIDER_SITE_OTHER): Payer: Medicare PPO | Admitting: Pharmacist Clinician (PhC)/ Clinical Pharmacy Specialist

## 2013-02-18 DIAGNOSIS — Z7901 Long term (current) use of anticoagulants: Secondary | ICD-10-CM

## 2013-02-18 DIAGNOSIS — I4891 Unspecified atrial fibrillation: Secondary | ICD-10-CM

## 2013-02-25 ENCOUNTER — Ambulatory Visit (INDEPENDENT_AMBULATORY_CARE_PROVIDER_SITE_OTHER): Payer: Medicare PPO | Admitting: Pharmacist Clinician (PhC)/ Clinical Pharmacy Specialist

## 2013-02-25 ENCOUNTER — Telehealth: Payer: Self-pay | Admitting: Cardiovascular Disease

## 2013-02-25 DIAGNOSIS — I4891 Unspecified atrial fibrillation: Secondary | ICD-10-CM

## 2013-02-25 DIAGNOSIS — Z7901 Long term (current) use of anticoagulants: Secondary | ICD-10-CM

## 2013-02-25 LAB — POCT INR: INR: 2.6

## 2013-02-25 NOTE — Telephone Encounter (Signed)
Message forwarded to K. Alvstad, PharmD.  

## 2013-02-25 NOTE — Telephone Encounter (Signed)
See anticoag note

## 2013-02-25 NOTE — Telephone Encounter (Signed)
Calling to report an inr result.  Please call.

## 2013-03-04 ENCOUNTER — Ambulatory Visit (INDEPENDENT_AMBULATORY_CARE_PROVIDER_SITE_OTHER): Payer: Medicare PPO | Admitting: Pharmacist Clinician (PhC)/ Clinical Pharmacy Specialist

## 2013-03-04 DIAGNOSIS — I4891 Unspecified atrial fibrillation: Secondary | ICD-10-CM

## 2013-03-04 DIAGNOSIS — Z7901 Long term (current) use of anticoagulants: Secondary | ICD-10-CM

## 2013-03-04 LAB — POCT INR: INR: 2.2

## 2013-03-18 ENCOUNTER — Ambulatory Visit (INDEPENDENT_AMBULATORY_CARE_PROVIDER_SITE_OTHER): Payer: Medicare PPO | Admitting: Pharmacist Clinician (PhC)/ Clinical Pharmacy Specialist

## 2013-03-18 DIAGNOSIS — I4891 Unspecified atrial fibrillation: Secondary | ICD-10-CM

## 2013-03-18 DIAGNOSIS — Z7901 Long term (current) use of anticoagulants: Secondary | ICD-10-CM

## 2013-03-18 LAB — POCT INR: INR: 2.1

## 2013-03-28 ENCOUNTER — Other Ambulatory Visit: Payer: Self-pay | Admitting: Cardiovascular Disease

## 2013-03-31 NOTE — Telephone Encounter (Signed)
Rx was sent to pharmacy electronically. 

## 2013-04-01 ENCOUNTER — Ambulatory Visit (INDEPENDENT_AMBULATORY_CARE_PROVIDER_SITE_OTHER): Payer: Medicare PPO | Admitting: Pharmacist Clinician (PhC)/ Clinical Pharmacy Specialist

## 2013-04-01 ENCOUNTER — Telehealth: Payer: Self-pay | Admitting: *Deleted

## 2013-04-01 DIAGNOSIS — Z7901 Long term (current) use of anticoagulants: Secondary | ICD-10-CM

## 2013-04-01 DIAGNOSIS — I4891 Unspecified atrial fibrillation: Secondary | ICD-10-CM

## 2013-04-01 LAB — POCT INR: INR: 2.3

## 2013-04-01 NOTE — Telephone Encounter (Signed)
Jesse Wall was calling in PT/INR   PT 23.0 INR 2.3  Current dose of coumadin 3.75mg   M, W F and all other 7.5 mg

## 2013-04-01 NOTE — Progress Notes (Signed)
INR 2.3 confirmed with Langley Gauss

## 2013-04-01 NOTE — Telephone Encounter (Signed)
See anticoag note

## 2013-04-15 ENCOUNTER — Telehealth: Payer: Self-pay | Admitting: Nurse Practitioner

## 2013-04-15 LAB — POCT INR: INR: 1.7

## 2013-04-15 NOTE — Telephone Encounter (Signed)
Jesse Wall calling with today's INR result - 1.7  Patient currently taking 7.5 mg alternating with 3.75 mg  I have increased his dose to 7.5 mg for 5 days and 3.75 mg for 2 days. Recheck in 2 weeks.   [name], RN, [place] [address] [place], [place]  [address] [phone]

## 2013-04-16 ENCOUNTER — Ambulatory Visit (INDEPENDENT_AMBULATORY_CARE_PROVIDER_SITE_OTHER): Payer: Medicare PPO | Admitting: Pharmacist Clinician (PhC)/ Clinical Pharmacy Specialist

## 2013-04-16 DIAGNOSIS — Z7901 Long term (current) use of anticoagulants: Secondary | ICD-10-CM

## 2013-04-16 DIAGNOSIS — I4891 Unspecified atrial fibrillation: Secondary | ICD-10-CM

## 2013-05-21 DEATH — deceased

## 2013-07-16 ENCOUNTER — Ambulatory Visit: Payer: Self-pay | Admitting: Pharmacist Clinician (PhC)/ Clinical Pharmacy Specialist

## 2013-07-16 DIAGNOSIS — I4891 Unspecified atrial fibrillation: Secondary | ICD-10-CM

## 2013-07-16 DIAGNOSIS — Z7901 Long term (current) use of anticoagulants: Secondary | ICD-10-CM

## 2014-04-02 IMAGING — CR DG KNEE COMPLETE 4+V*R*
4 series · 4 of 4 positions shown · non-contrast
Comparison: None.

CLINICAL DATA: Twisting injury.  Pain.

RIGHT KNEE - COMPLETE 4+ VIEW

[view not recorded (1 of 4)]
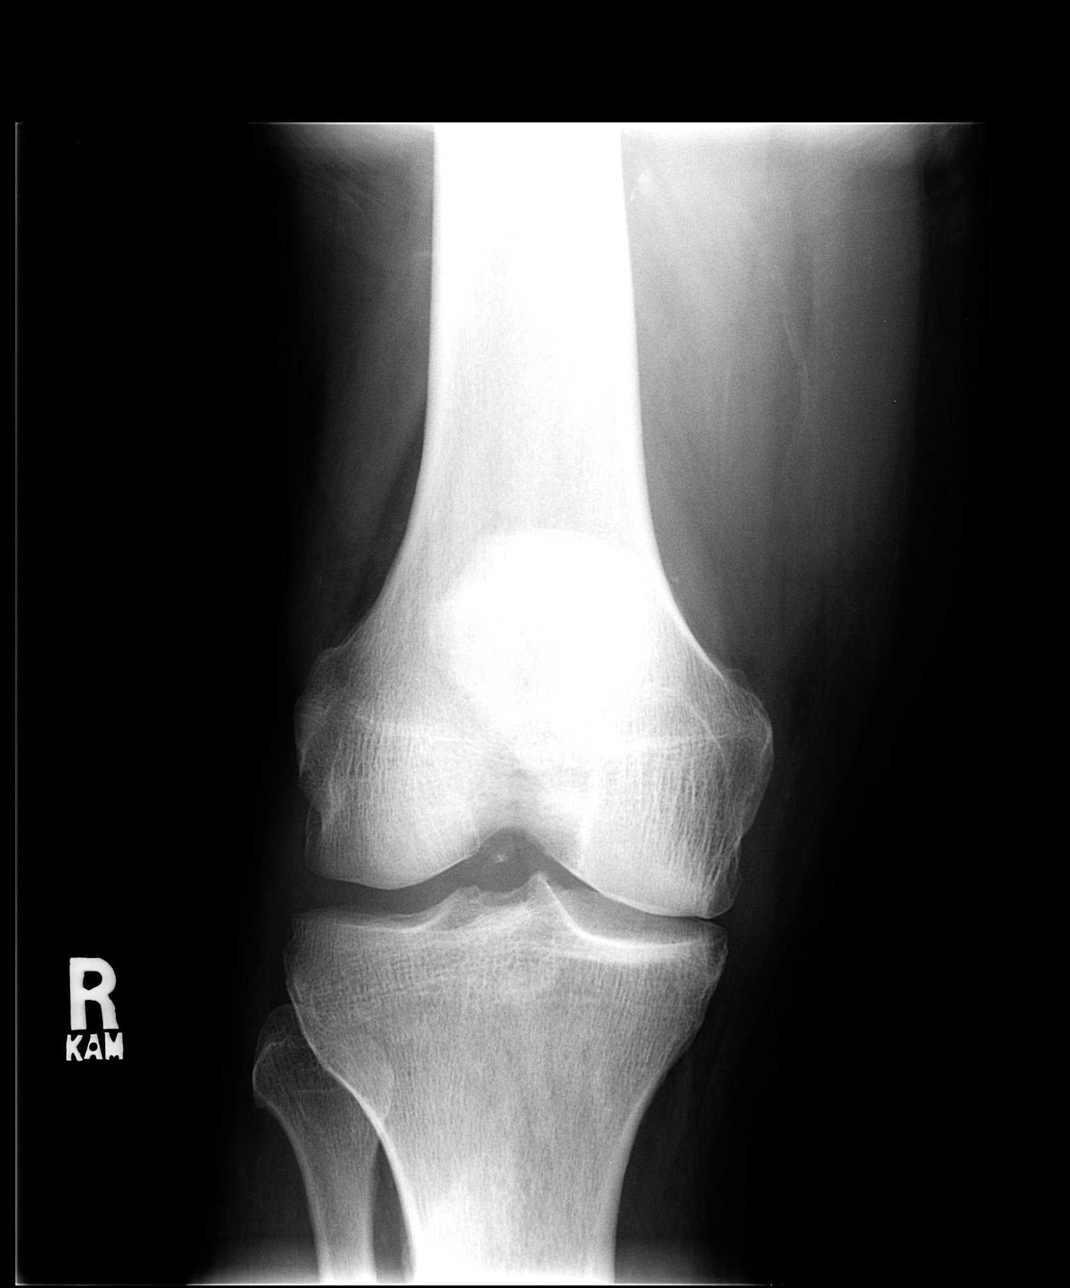

[view not recorded (2 of 4)]
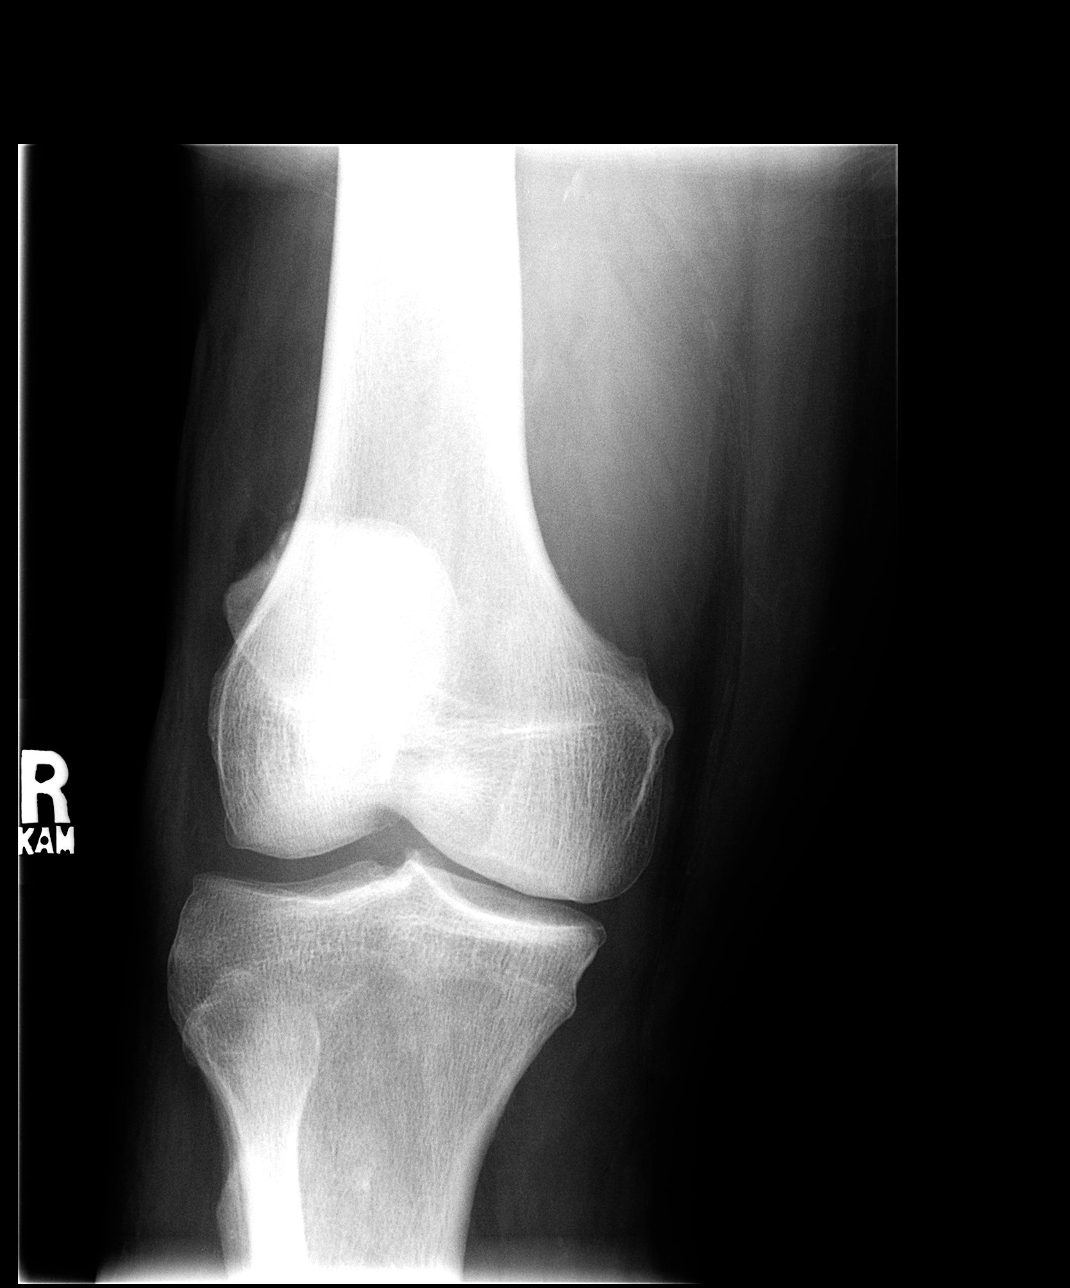

[view not recorded (3 of 4)]
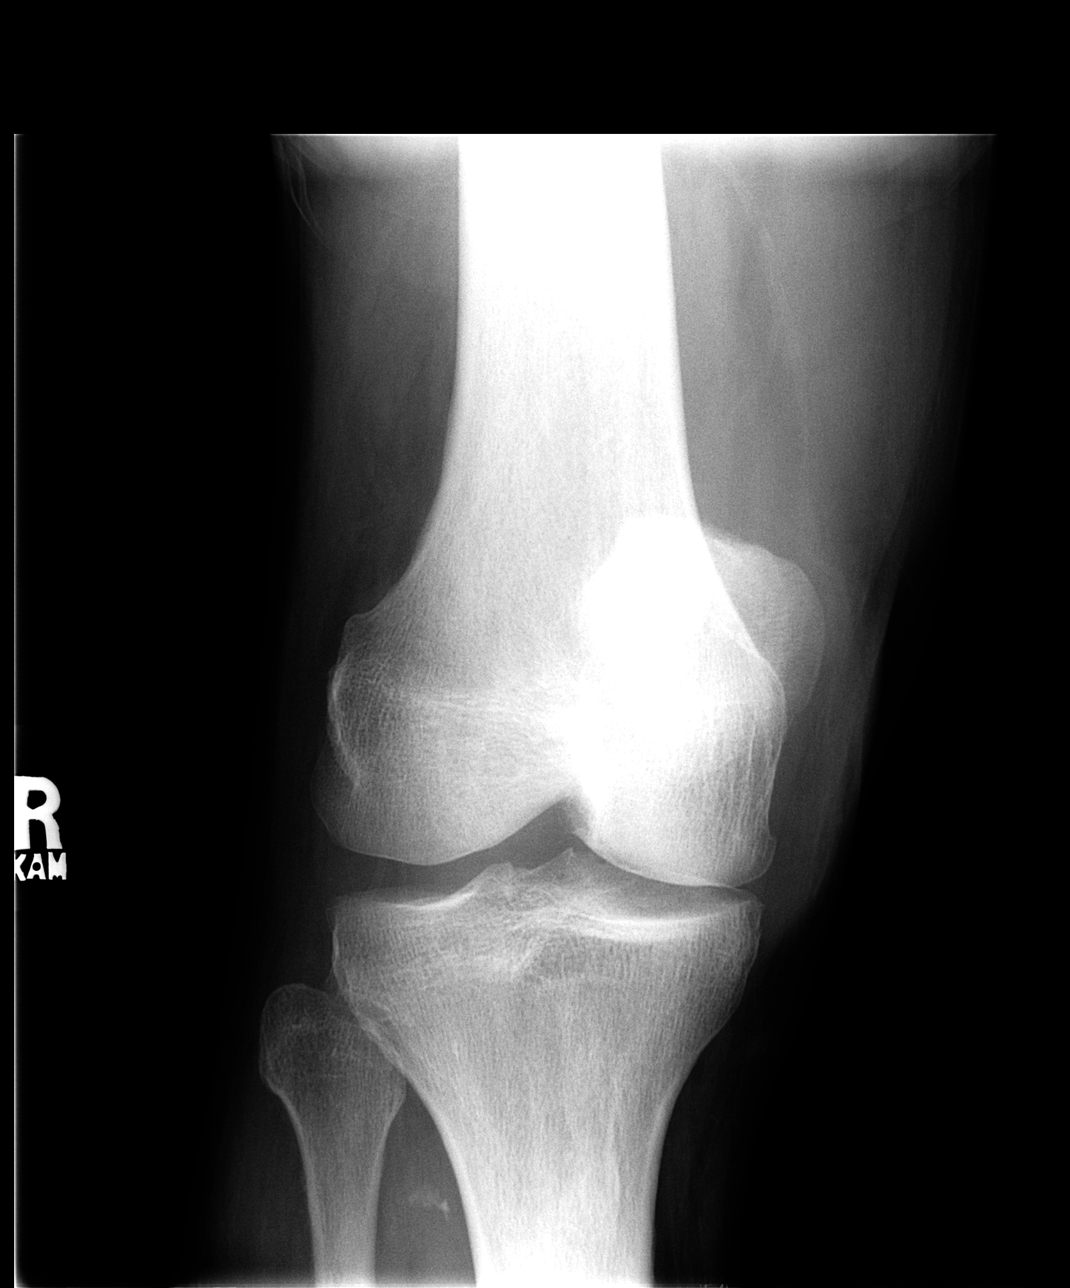

[view not recorded (4 of 4)]
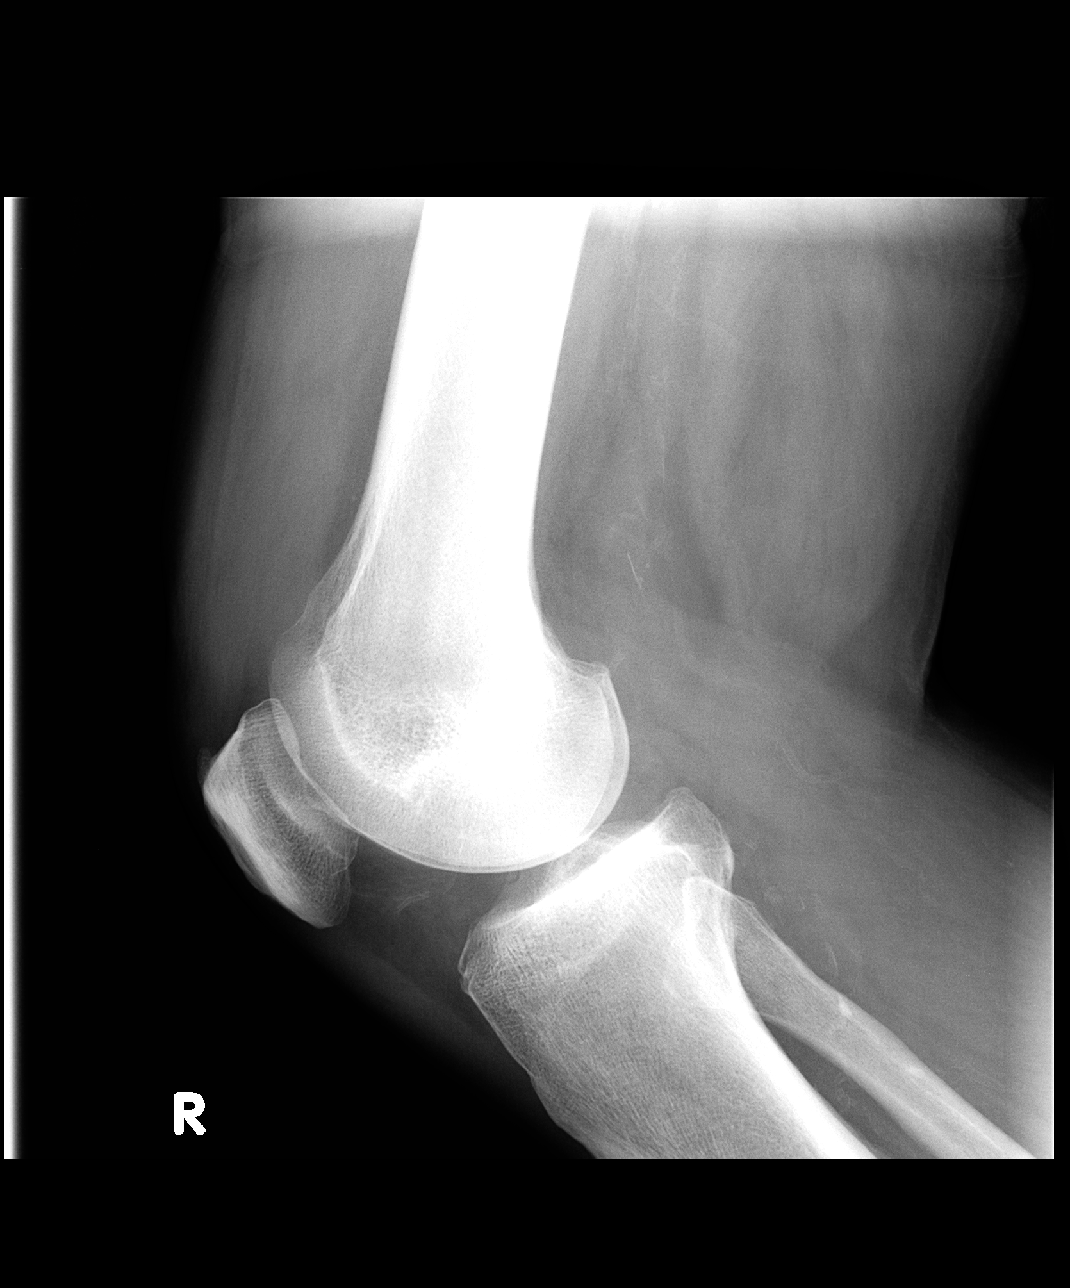

[4 of 4 positions shown; findings below may reference images not displayed]

FINDINGS: The knee is aligned.  There is a small suprapatellar
joint effusion.  No significant degenerative change for patient
age.  No acute fracture identified.  Peripheral vascular
calcifications present.
IMPRESSION: 1. Small suprapatellar joint effusion.
2.  No acute bony abnormality identified.
3.  Peripheral vascular calcifications.
